# Patient Record
Sex: Female | Born: 1975 | Hispanic: Yes | Marital: Married
Health system: Southern US, Community
[De-identification: ages and names within clinical notes are randomized; demographics above are authoritative.]

---

## 2005-05-05 ENCOUNTER — Observation Stay: Payer: Self-pay | Admitting: Certified Nurse Midwife

## 2005-07-08 ENCOUNTER — Observation Stay: Payer: Self-pay | Admitting: Obstetrics and Gynecology

## 2005-07-08 ENCOUNTER — Inpatient Hospital Stay: Payer: Self-pay | Admitting: Obstetrics and Gynecology

## 2008-09-24 ENCOUNTER — Ambulatory Visit: Payer: Self-pay | Admitting: Family Medicine

## 2009-03-01 ENCOUNTER — Observation Stay: Payer: Self-pay | Admitting: Obstetrics and Gynecology

## 2009-03-18 ENCOUNTER — Observation Stay: Payer: Self-pay | Admitting: Obstetrics and Gynecology

## 2009-03-23 ENCOUNTER — Observation Stay: Payer: Self-pay | Admitting: Obstetrics and Gynecology

## 2009-04-10 ENCOUNTER — Inpatient Hospital Stay: Payer: Self-pay | Admitting: Obstetrics and Gynecology

## 2011-09-09 ENCOUNTER — Emergency Department: Payer: Self-pay | Admitting: Internal Medicine

## 2011-09-09 LAB — CBC
HCT: 39.4 %
HGB: 13.2 g/dL
MCH: 33.1 pg
MCHC: 33.4 g/dL
MCV: 99 fL
Platelet: 199 x10 3/mm 3
RBC: 3.98 X10 6/mm 3
RDW: 12.9 %
WBC: 7 x10 3/mm 3

## 2011-09-09 LAB — URINALYSIS, COMPLETE
Bacteria: NONE SEEN
Bilirubin,UR: NEGATIVE
Glucose,UR: NEGATIVE mg/dL
Ketone: NEGATIVE
Leukocyte Esterase: NEGATIVE
Nitrite: NEGATIVE
Ph: 6
Protein: NEGATIVE
RBC,UR: <1 /HPF
Specific Gravity: 1.02
Squamous Epithelial: 3
WBC UR: <1 /HPF

## 2011-09-25 ENCOUNTER — Ambulatory Visit: Payer: Self-pay | Admitting: Obstetrics and Gynecology

## 2011-09-25 LAB — URINALYSIS, COMPLETE
Glucose,UR: NEGATIVE mg/dL (ref 0–75)
Leukocyte Esterase: NEGATIVE
Ph: 5 (ref 4.5–8.0)
RBC,UR: 1 /HPF (ref 0–5)
Squamous Epithelial: 6
WBC UR: 1 /HPF (ref 0–5)

## 2012-03-01 ENCOUNTER — Ambulatory Visit: Payer: Self-pay | Admitting: Family Medicine

## 2013-11-26 ENCOUNTER — Ambulatory Visit: Payer: Self-pay

## 2014-07-19 NOTE — Op Note (Signed)
PATIENT NAME:  Patricia Bush, [name] MR#:  [id number] DATE OF BIRTH:  Feb 21, 1976  DATE OF PROCEDURE:  09/25/2011  PREOPERATIVE DIAGNOSIS: Incomplete abortion.   POSTOPERATIVE DIAGNOSIS: Incomplete abortion.   PROCEDURE: Suction dilatation and curettage.   SURGEON: Ricky L. Logan BoresEvans, MD  ANESTHESIA: Bag and mask.  FINDINGS: Empty sac unchanged after 10 days on ultrasound.   SPECIMENS: Products of conception.   ESTIMATED BLOOD LOSS: 100 mL.   COMPLICATIONS: None.   DRAINS: None.   PROCEDURE IN DETAIL: Patient was followed in the office with above diagnosis of incomplete abortion. Discussed options, elected to proceed with above procedure. Consent was signed. Preoperative antibiotics given. Taken to the Operating Room, placed in supine position where anesthesia was initiated then placed in dorsal lithotomy position using candy cane stirrups, prepped and draped in usual sterile fashion. Cervix was visualized, grasped with single-tooth tenaculum, easily sounded up to permit a #8 suction curette which was passed with return of sac and ultimately minimally bubbly blood with tightening of the os.   Polyp forceps then used to gently explore the cornua with no evidence of  product. Suction curette passed one more time. Again good uterine cry was noted throughout. Procedure was felt to achieve maximum efficacy. Instruments removed. Cervix was hemostatic. Bimanual showed a firming uterus.   Patient tolerated procedure well. She will be discharged home with light activity, pelvic rest for two weeks, antibiotics for five days and to be followed up in my office.   ____________________________ Reatha Harpsicky L. Logan BoresEvans, MD rle:cms D: 09/25/2011 14:42:33 ET T: 09/25/2011 15:14:57 ET JOB#: [id number]  cc: Ricky L. Logan BoresEvans, MD, <Dictator>  Augustina MoodICK L Samanatha Brammer MD ELECTRONICALLY SIGNED 09/26/2011 14:36

## 2017-06-21 ENCOUNTER — Ambulatory Visit: Payer: Self-pay | Admitting: Psychology

## 2019-06-06 ENCOUNTER — Emergency Department
Admission: EM | Admit: 2019-06-06 | Discharge: 2019-06-07 | Disposition: A | Discharge status: Home / Self Care | Payer: BC Managed Care – PPO | Attending: Emergency Medicine | Admitting: Emergency Medicine

## 2019-06-06 ENCOUNTER — Other Ambulatory Visit: Payer: Self-pay

## 2019-06-06 ENCOUNTER — Emergency Department: Payer: BC Managed Care – PPO

## 2019-06-06 DIAGNOSIS — R202 Paresthesia of skin: Secondary | ICD-10-CM | POA: Insufficient documentation

## 2019-06-06 DIAGNOSIS — R471 Dysarthria and anarthria: Secondary | ICD-10-CM | POA: Diagnosis not present

## 2019-06-06 DIAGNOSIS — R519 Headache, unspecified: Secondary | ICD-10-CM | POA: Diagnosis present

## 2019-06-06 DIAGNOSIS — R531 Weakness: Secondary | ICD-10-CM | POA: Insufficient documentation

## 2019-06-06 DIAGNOSIS — Z20822 Contact with and (suspected) exposure to covid-19: Secondary | ICD-10-CM | POA: Diagnosis not present

## 2019-06-06 DIAGNOSIS — G43101 Migraine with aura, not intractable, with status migrainosus: Secondary | ICD-10-CM | POA: Insufficient documentation

## 2019-06-06 LAB — GLUCOSE, CAPILLARY: Glucose-Capillary: 116 mg/dL — ABNORMAL HIGH (ref 70–99)

## 2019-06-06 NOTE — ED Triage Notes (Signed)
Info obtained via [facility] interpreter Services.  Patient reports started feeling really bad and almost fainted.  Reports symptoms began around 4 pm.  Reports feels like left side of face is having a cramp.

## 2019-06-06 NOTE — ED Notes (Signed)
Report given to Gracie RN.

## 2019-06-06 NOTE — ED Notes (Signed)
Patient c/o light sensitivity and mild nausea per video interpreter. Patient able to move all 4 extremities. Patient c/o weakness on left side.  Patient able to grimace with equal symmetry of eyebrows. Patient had difficulty attempting a smile, but could move tongue side to side easily.

## 2019-06-07 ENCOUNTER — Emergency Department: Payer: BC Managed Care – PPO

## 2019-06-07 LAB — DIFFERENTIAL
Abs Immature Granulocytes: 0.02 10*3/uL (ref 0.00–0.07)
Basophils Absolute: 0 10*3/uL (ref 0.0–0.1)
Basophils Relative: 0 %
Eosinophils Absolute: 0.1 10*3/uL (ref 0.0–0.5)
Eosinophils Relative: 1 %
Immature Granulocytes: 0 %
Lymphocytes Relative: 46 %
Lymphs Abs: 4 10*3/uL (ref 0.7–4.0)
Monocytes Absolute: 0.6 10*3/uL (ref 0.1–1.0)
Monocytes Relative: 6 %
Neutro Abs: 4.1 10*3/uL (ref 1.7–7.7)
Neutrophils Relative %: 47 %

## 2019-06-07 LAB — CBC
HCT: 41.4 % (ref 36.0–46.0)
Hemoglobin: 13.6 g/dL (ref 12.0–15.0)
MCH: 32.5 pg (ref 26.0–34.0)
MCHC: 32.9 g/dL (ref 30.0–36.0)
MCV: 99 fL (ref 80.0–100.0)
Platelets: 184 10*3/uL (ref 150–400)
RBC: 4.18 MIL/uL (ref 3.87–5.11)
RDW: 12 % (ref 11.5–15.5)
WBC: 8.8 10*3/uL (ref 4.0–10.5)
nRBC: 0 % (ref 0.0–0.2)

## 2019-06-07 LAB — COMPREHENSIVE METABOLIC PANEL
ALT: 22 U/L (ref 0–44)
AST: 36 U/L (ref 15–41)
Albumin: 4.3 g/dL (ref 3.5–5.0)
Alkaline Phosphatase: 55 U/L (ref 38–126)
Anion gap: 11 (ref 5–15)
BUN: 15 mg/dL (ref 6–20)
CO2: 24 mmol/L (ref 22–32)
Calcium: 9.3 mg/dL (ref 8.9–10.3)
Chloride: 105 mmol/L (ref 98–111)
Creatinine, Ser: 0.68 mg/dL (ref 0.44–1.00)
GFR calc Af Amer: >60 mL/min (ref >60)
GFR calc non Af Amer: >60 mL/min (ref >60)
Glucose, Bld: 111 mg/dL — ABNORMAL HIGH (ref 70–99)
Potassium: 3.5 mmol/L (ref 3.5–5.1)
Sodium: 140 mmol/L (ref 135–145)
Total Bilirubin: 1.2 mg/dL (ref 0.3–1.2)
Total Protein: 8 g/dL (ref 6.5–8.1)

## 2019-06-07 LAB — APTT: aPTT: 28 seconds (ref 24–36)

## 2019-06-07 LAB — PROTIME-INR
INR: 0.9 (ref 0.8–1.2)
Prothrombin Time: 12.4 seconds (ref 11.4–15.2)

## 2019-06-07 LAB — ETHANOL: Alcohol, Ethyl (B): <10 mg/dL (ref <10)

## 2019-06-07 LAB — SARS CORONAVIRUS 2 (TAT 6-24 HRS): SARS Coronavirus 2: NEGATIVE

## 2019-06-07 MED ORDER — NAPROXEN 500 MG PO TABS: 500.0000 mg | ORAL_TABLET | Freq: Two times a day (BID) | ORAL | 0 refills | Status: AC | PRN

## 2019-06-07 MED ORDER — DIPHENHYDRAMINE HCL 50 MG/ML IJ SOLN
25.0000 mg | Freq: Once | INTRAMUSCULAR | Status: AC
  Administered 2019-06-07: 25 mg via INTRAVENOUS
  Filled 2019-06-07: qty 1

## 2019-06-07 MED ORDER — KETOROLAC TROMETHAMINE 30 MG/ML IJ SOLN
15.0000 mg | INTRAMUSCULAR | Status: AC
  Administered 2019-06-07: 15 mg via INTRAVENOUS
  Filled 2019-06-07: qty 1

## 2019-06-07 MED ORDER — DIPHENHYDRAMINE HCL 25 MG PO CAPS: 50.0000 mg | ORAL_CAPSULE | Freq: Four times a day (QID) | ORAL | 0 refills | Status: AC | PRN

## 2019-06-07 MED ORDER — SODIUM CHLORIDE 0.9 % IV BOLUS
1000.0000 mL | Freq: Once | INTRAVENOUS | Status: AC
  Administered 2019-06-07: 1000 mL via INTRAVENOUS

## 2019-06-07 MED ORDER — METOCLOPRAMIDE HCL 10 MG PO TABS: 10.0000 mg | ORAL_TABLET | Freq: Four times a day (QID) | ORAL | 0 refills | Status: AC | PRN

## 2019-06-07 MED ORDER — METOCLOPRAMIDE HCL 5 MG/ML IJ SOLN
10.0000 mg | Freq: Once | INTRAMUSCULAR | Status: AC
  Administered 2019-06-07: 10 mg via INTRAVENOUS
  Filled 2019-06-07: qty 2

## 2019-06-07 NOTE — ED Notes (Signed)
Neurology called at this time, given phone number [phone] by RN on teleneuro cart to call for teleneuro consult. Spoke with person on phone who said they were alerting the neurologist the cart is in the room for them to speak with pt.

## 2019-06-07 NOTE — Consult Note (Signed)
[facility] Tele[facility]urology Consult Services  Stat Consult  Date of Service:   06/07/2019 00:10:33  Impression:     .  X83.3 - Complicated migraine  Comments/Sign-Out: Although a small vessel stroke as possible I think it is more likely this is complicated migraine. I suggested treating her symptomatically getting an MRI in the morning. She is not a candidate for thrombolytic therapy or interventional procedures. I think also she may have low-lying cerebellar tonsil suggesting a Chiari I malformation but the MRI should tell that two.  CT HEAD: Showed No Acute Hemorrhage or Acute Core Infarct Reviewed No evidence of acute ischemic change, hemorrhage, or mass lesion but possible low-lying cerebellar tonsils.  Metrics: [facility] Notification Time: 06/07/2019 00:10:33 Stamp Time: 06/07/2019 00:10:33 Callback Response Time: 06/07/2019 00:11:39  Our recommendations are outlined below.  Imaging Studies:     .  MRI Head  Therapies:     .  Physical Therapy, Occupational Therapy, Speech Therapy Assessment When Applicable  Disposition: Neurology Follow Up Recommended  Sign Out:     .  Discussed with Emergency Department Provider  ----------------------------------------------------------------------------------------------------  Chief Complaint: Headache and left-sided numbness and weakness  History of Present Illness: Patient is a 44 year old Female.  This 44 year old woman was interviewed and examined for the assistance of a Spanish language interpreter. She was well until 1600 when she developed headache, photophobia, phonophobia, and then sometime thereafter left-sided weakness and numbness including left facial weakness. She's had occasional headaches and she had a similar event prompting a visit to the hospital in 2010 with a negative CT scan. She felt faint. She was not febrile. She has received Benadryl, Toradol, and Reglan with some but incomplete  relief.    Past Medical History:     . Hypertension     . Hyperlipidemia     . There is NO history of Diabetes Mellitus     . There is NO history of Atrial Fibrillation     . There is NO history of Coronary Artery Disease     . There is NO history of Stroke  Anticoagulant use:  No  Antiplatelet use: No     Examination: BP(129/89), Pulse(83), Blood Glucose(111) 1A: Level of Consciousness - Alert; keenly responsive + 0 1B: Ask Month and Age - Both Questions Right + 0 1C: Blink Eyes & Squeeze Hands - Performs Both Tasks + 0 2: Test Horizontal Extraocular Movements - Normal + 0 3: Test Visual Fields - No Visual Loss + 0 4: Test Facial Palsy (Use Grimace if Obtunded) - Normal symmetry + 0 5A: Test Left Arm Motor Drift - No Drift for 10 Seconds + 0 5B: Test Right Arm Motor Drift - No Drift for 10 Seconds + 0 6A: Test Left Leg Motor Drift - No Drift for 5 Seconds + 0 6B: Test Right Leg Motor Drift - No Drift for 5 Seconds + 0 7: Test Limb Ataxia (FNF/Heel-Shin) - No Ataxia + 0 8: Test Sensation - Mild-Moderate Loss: Less Sharp/More Dull + 1 9: Test Language/Aphasia - Normal; No aphasia + 0 10: Test Dysarthria - Normal + 0 11: Test Extinction/Inattention - No abnormality + 0  NIHSS Score: 1   Patient/Family was informed the Neurology Consult would occur via TeleHealth consult by way of interactive audio and video telecommunications and consented to receiving care in this manner.  Due to the immediate potential for life-threatening deterioration due to underlying acute neurologic illness, I spent 33 minutes providing critical care. This time includes  time for face to face visit via telemedicine, review of medical records, imaging studies and discussion of findings with providers, the patient and/or family.   Dr Elray Mcgregor   [facility] [phone]  Case [id number]

## 2019-06-07 NOTE — ED Notes (Signed)
Decision to treat as migraine and get MRI, discussed between MD stafford and Neurologist

## 2019-06-07 NOTE — Discharge Instructions (Addendum)
Your lab tests and MRI of the brain were all normal.  Your symptoms appear to be due to migraine headache.  Take medications as prescribed if symptoms return.  You should follow up with a primary care doctor for continued evaluation of your symptoms.

## 2019-06-07 NOTE — ED Notes (Signed)
Neurologist talking with MD stafford at this time

## 2019-06-07 NOTE — ED Notes (Signed)
Pt transported to MRI 

## 2019-06-07 NOTE — ED Provider Notes (Signed)
[facility] Emergency Department Provider Note  ____________________________________________  Time seen: Approximately 1:27 AM  I have reviewed the triage vital signs and the nursing notes.   HISTORY  Chief Complaint Dizziness  Encounter completed with Spanish video interpreter Byrd Hesselbach  HPI Patricia Bush is a 44 y.o. female with a past history of hyperlipidemia who comes to the ED complaining of generalized headache that started at 4 PM yesterday, constant, gradually worsening, associated with feeling of tingling and weakness in the left face arm and leg.  Also associated with light sensitivity and sound sensitivity.  Nonradiating.  She also reports that her speech is dysarthric but denies word finding difficulty or vision change  She notes that she does get severe headaches frequently, associated with light and sound sensitivity.  They are improved by going to a dark quiet room by herself and they tend to last the whole day.  She denies any neck pain trauma fevers or chills.      Past medical history hyperlipidemia, anxiety   There are no problems to display for this patient.       Prior to Admission medications   Medication Sig Start Date End Date Taking? Authorizing Provider  diphenhydrAMINE (BENADRYL) 25 mg capsule Take 2 capsules (50 mg total) by mouth every 6 (six) hours as needed. 06/07/19   Sharman Cheek, MD  metoCLOPramide (REGLAN) 10 MG tablet Take 1 tablet (10 mg total) by mouth every 6 (six) hours as needed. 06/07/19   Sharman Cheek, MD  naproxen (NAPROSYN) 500 MG tablet Take 1 tablet (500 mg total) by mouth 2 (two) times daily as needed for headache. 06/07/19   Sharman Cheek, MD  Crestor Zoloft   Allergies Patient has no allergy information on record.   No family history on file.  Social History Social History   Tobacco Use  . Smoking status: Not on file  Substance Use Topics  . Alcohol use: Not on file  . Drug  use: Not on file  No tobacco alcohol or drug use  Review of Systems  Constitutional:   No fever or chills.  ENT:   No sore throat. No rhinorrhea. Cardiovascular:   No chest pain or syncope. Respiratory:   No dyspnea or cough. Gastrointestinal:   Negative for abdominal pain, vomiting and diarrhea.  Musculoskeletal:   Negative for focal pain or swelling All other systems reviewed and are negative except as documented above in ROS and HPI.  ____________________________________________   PHYSICAL EXAM:  VITAL SIGNS: ED Triage Vitals  Enc Vitals Group     BP 06/06/19 2156 (!) 151/101     Pulse Rate 06/06/19 2156 91     Resp 06/06/19 2156 18     Temp 06/06/19 2207 98.3 F (36.8 C)     Temp src --      SpO2 06/06/19 2156 100 %     Weight 06/06/19 2151 185 lb (83.9 kg)     Height 06/06/19 2151 5\' 6"  (1.676 m)     Head Circumference --      Peak Flow --      Pain Score 06/06/19 2208 9     Pain Loc --      Pain Edu? --      Excl. in GC? --     Vital signs reviewed, nursing assessments reviewed.   Constitutional:   Alert and oriented. Non-toxic appearance. Eyes:   Conjunctivae are normal. EOMI. PERRL. ENT      Head:   Normocephalic  and atraumatic.      Nose:   Wearing a mask.      Mouth/Throat:   Wearing a mask.      Neck:   No meningismus. Full ROM. Hematological/Lymphatic/Immunilogical:   No cervical lymphadenopathy. Cardiovascular:   RRR. Symmetric bilateral radial and DP pulses.  No murmurs. Cap refill less than 2 seconds. Respiratory:   Normal respiratory effort without tachypnea/retractions. Breath sounds are clear and equal bilaterally. No wheezes/rales/rhonchi. Gastrointestinal:   Soft and nontender. Non distended. There is no CVA tenderness.  No rebound, rigidity, or guarding.  Musculoskeletal:   Normal range of motion in all extremities. No joint effusions.  No lower extremity tenderness.  No edema. Neurologic:   Mild dysarthria Left corner of the mouth facial  droop not involving the forehead Mild weakness left arm Altered sensation left arm NIH stroke scale 4 Skin:    Skin is warm, dry and intact. No rash noted.  No petechiae, purpura, or bullae.  ____________________________________________    LABS (pertinent positives/negatives) (all labs ordered are listed, but only abnormal results are displayed) Labs Reviewed  GLUCOSE, CAPILLARY - Abnormal; Notable for the following components:      Result Value   Glucose-Capillary 116 (*)    All other components within normal limits  COMPREHENSIVE METABOLIC PANEL - Abnormal; Notable for the following components:   Glucose, Bld 111 (*)    All other components within normal limits  SARS CORONAVIRUS 2 (TAT 6-24 HRS)  ETHANOL  PROTIME-INR  APTT  CBC  DIFFERENTIAL  URINE DRUG SCREEN, QUALITATIVE ([facility] ONLY)  URINALYSIS, COMPLETE (UACMP) WITH MICROSCOPIC  POC URINE PREG, ED   ____________________________________________   EKG  Interpreted by me  Date: 06/07/2019  Rate: 84  Rhythm: normal sinus rhythm  QRS Axis: normal  Intervals: normal  ST/T Wave abnormalities: normal  Conduction Disutrbances: none  Narrative Interpretation: unremarkable      ____________________________________________    RADIOLOGY  CT Head Wo Contrast  Result Date: 06/06/2019 CLINICAL DATA:  Headache EXAM: CT HEAD WITHOUT CONTRAST TECHNIQUE: Contiguous axial images were obtained from the base of the skull through the vertex without intravenous contrast. COMPARISON:  CT head 03/01/2009 FINDINGS: Brain: No evidence of acute infarction, hemorrhage, hydrocephalus, extra-axial collection or mass lesion/mass effect. Midline intracranial structures are unremarkable. The cerebellar tonsils appear normally positioned. Vascular: No hyperdense vessel or unexpected calcification. Skull: No calvarial fracture or suspicious osseous lesion. No scalp swelling or hematoma. Sinuses/Orbits: Paranasal sinuses and mastoid air cells  are predominantly clear. Included orbital structures are unremarkable. Other: None IMPRESSION: No acute intracranial findings. Electronically Signed   By: Kreg Shropshire M.D.   On: 06/06/2019 23:06   MR BRAIN WO CONTRAST  Result Date: 06/07/2019 CLINICAL DATA:  Initial evaluation for acute headache, nausea, left-sided weakness. EXAM: MRI HEAD WITHOUT CONTRAST TECHNIQUE: Multiplanar, multiecho pulse sequences of the brain and surrounding structures were obtained without intravenous contrast. COMPARISON:  Prior head CT from 07/29/2019. FINDINGS: Brain: Cerebral volume within normal limits for age. No focal parenchymal signal abnormality identified. No abnormal foci of restricted diffusion to suggest acute or subacute ischemia. Gray-white matter differentiation well maintained. No encephalomalacia to suggest chronic cortical infarction. No foci of susceptibility artifact to suggest acute or chronic intracranial hemorrhage. No mass lesion, midline shift or mass effect. Ventricles normal size without hydrocephalus. No extra-axial fluid collection. Pituitary gland suprasellar region normal. Midline structures intact. Vascular: Major intracranial vascular flow voids are well maintained and normal in appearance. Skull and upper cervical spine: Mild cerebellar  tonsillar ectopia of approximately 3 mm without frank Chiari malformation. Craniocervical junction otherwise unremarkable. Bone marrow signal intensity within normal limits. No scalp soft tissue abnormality. Sinuses/Orbits: Globes and orbital soft tissues within normal limits. Mild scattered mucosal thickening noted within the ethmoidal air cells and maxillary sinuses. No air-fluid level to suggest acute sinusitis. Mastoid air cells are clear. Inner ear structures grossly normal. Other: None. IMPRESSION: 1. No acute intracranial abnormality. 2. 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 3. Otherwise unremarkable and normal brain MRI.  Electronically Signed   By: Rise Mu M.D.   On: 06/07/2019 03:06    ____________________________________________   PROCEDURES Procedures  ____________________________________________  DIFFERENTIAL DIAGNOSIS   Complicated migraine, ischemic stroke  CLINICAL IMPRESSION / ASSESSMENT AND PLAN / ED COURSE  Medications ordered in the ED: Medications  ketorolac (TORADOL) 30 MG/ML injection 15 mg (15 mg Intravenous Given 06/07/19 0025)  metoCLOPramide (REGLAN) injection 10 mg (10 mg Intravenous Given 06/07/19 0024)  diphenhydrAMINE (BENADRYL) injection 25 mg (25 mg Intravenous Given 06/07/19 0024)  sodium chloride 0.9 % bolus 1,000 mL (1,000 mLs Intravenous New Bag/Given 06/07/19 0127)    Pertinent labs & imaging results that were available during my care of the patient were reviewed by me and considered in my medical decision making (see chart for details).  Stephaniemarie Caitlinn Klinker was evaluated in Emergency Department on 06/07/2019 for the symptoms described in the history of present illness. She was evaluated in the context of the global COVID-19 pandemic, which necessitated consideration that the patient might be at risk for infection with the SARS-CoV-2 virus that causes COVID-19. Institutional protocols and algorithms that pertain to the evaluation of patients at risk for COVID-19 are in a state of rapid change based on information released by regulatory bodies including the CDC and federal and state organizations. These policies and algorithms were followed during the patient's care in the ED.   Patient presents with headache pattern highly consistent with migraine.  However, she has some neurologic deficits on exam.  Most likely complicated migraine.  Fingerstick normal.  CT head obtained which is normal.  Will obtain lab panel, neurology consult.  Start treatment with Reglan, Benadryl, Toradol IV.  Clinical Course as of Jun 06 320  Sat Jun 07, 2019  0122 Case discussed with the  neurology consultant who agrees that this is highly likely to be complicated migraine.  At the time of his exam, headache is improving after medications, and neurologic exam is improved.  He scores the stroke scale at 1 for reported sensation change, no weakness or facial droop..  Does not believe she needs a full stroke work-up.  Recommends migraine management and MRI brain, and if unremarkable, she can be discharged home.   [PS]    Clinical Course User Index [PS] Sharman Cheek, MD     ----------------------------------------- 3:22 AM on 06/07/2019 -----------------------------------------  MRI brain unremarkable.  Vital signs remain normal, patient feeling better.  Due to her Zoloft and trazodone, I will not start Imitrex right now, will do Reglan Benadryl naproxen as needed, follow-up with primary care.  ____________________________________________   FINAL CLINICAL IMPRESSION(S) / ED DIAGNOSES    Final diagnoses:  Complicated migraine with status migrainosus     ED Discharge Orders         Ordered    metoCLOPramide (REGLAN) 10 MG tablet  Every 6 hours PRN     06/07/19 0321    diphenhydrAMINE (BENADRYL) 25 mg capsule  Every 6 hours PRN  06/07/19 0321    naproxen (NAPROSYN) 500 MG tablet  2 times daily PRN     06/07/19 0321          Portions of this note were generated with dragon dictation software. Dictation errors may occur despite best attempts at proofreading.   Carrie Mew, MD 06/07/19 [phone]

## 2019-06-07 NOTE — ED Notes (Signed)
Neurologist on with pt at this time

## 2019-06-21 ENCOUNTER — Ambulatory Visit: Payer: BC Managed Care – PPO | Attending: Internal Medicine

## 2019-06-22 ENCOUNTER — Ambulatory Visit: Payer: BC Managed Care – PPO

## 2019-06-22 ENCOUNTER — Ambulatory Visit: Payer: BC Managed Care – PPO | Attending: Internal Medicine

## 2019-06-22 DIAGNOSIS — Z23 Encounter for immunization: Secondary | ICD-10-CM

## 2019-06-22 NOTE — Progress Notes (Signed)
   Covid-19 Vaccination Clinic  Name:  Patricia Bush    MRN: [id number] DOB: 27-Oct-1975  06/22/2019  Ms. Patricia Bush was observed post Covid-19 immunization for 15 minutes without incident. She was provided with Vaccine Information Sheet and instruction to access the V-Safe system.   Ms. Patricia Bush was instructed to call 911 with any severe reactions post vaccine: Marland Kitchen Difficulty breathing  . Swelling of face and throat  . A fast heartbeat  . A bad rash all over body  . Dizziness and weakness   Immunizations Administered    Name Date Dose VIS Date Route   Pfizer COVID-19 Vaccine 06/22/2019  1:22 PM 0.3 mL 03/07/2019 Intramuscular   Manufacturer: [employer], [employer]   Lot: [id number]   NDC: [address]-8

## 2019-07-12 ENCOUNTER — Ambulatory Visit: Payer: BC Managed Care – PPO

## 2019-07-13 ENCOUNTER — Ambulatory Visit: Payer: BC Managed Care – PPO | Attending: Internal Medicine

## 2019-07-13 DIAGNOSIS — Z23 Encounter for immunization: Secondary | ICD-10-CM

## 2019-07-13 NOTE — Progress Notes (Signed)
   Covid-19 Vaccination Clinic  Name:  Patricia Bush    MRN: [id number] DOB: Jun 11, 1975  07/13/2019  Ms. Patricia Bush was observed post Covid-19 immunization for 15 minutes without incident. She was provided with Vaccine Information Sheet and instruction to access the V-Safe system.   Ms. Patricia Bush was instructed to call 911 with any severe reactions post vaccine: Marland Kitchen Difficulty breathing  . Swelling of face and throat  . A fast heartbeat  . A bad rash all over body  . Dizziness and weakness   Immunizations Administered    Name Date Dose VIS Date Route   Pfizer COVID-19 Vaccine 07/13/2019  1:22 PM 0.3 mL 03/07/2019 Intramuscular   Manufacturer: [employer], [employer]   Lot: [id number]   NDC: [address]-8

## 2019-12-08 ENCOUNTER — Other Ambulatory Visit: Payer: Self-pay | Admitting: Internal Medicine

## 2019-12-08 DIAGNOSIS — Z1231 Encounter for screening mammogram for malignant neoplasm of breast: Secondary | ICD-10-CM

## 2020-01-02 ENCOUNTER — Ambulatory Visit
Admission: RE | Admit: 2020-01-02 | Discharge: 2020-01-02 | Disposition: A | Discharge status: Home / Self Care | Payer: BC Managed Care – PPO | Source: Ambulatory Visit | Attending: Internal Medicine | Admitting: Internal Medicine

## 2020-01-02 ENCOUNTER — Other Ambulatory Visit: Payer: Self-pay

## 2020-01-02 DIAGNOSIS — Z1231 Encounter for screening mammogram for malignant neoplasm of breast: Secondary | ICD-10-CM | POA: Diagnosis present

## 2021-03-15 ENCOUNTER — Other Ambulatory Visit: Payer: Self-pay | Admitting: Internal Medicine

## 2021-03-15 DIAGNOSIS — Z1231 Encounter for screening mammogram for malignant neoplasm of breast: Secondary | ICD-10-CM

## 2021-03-17 ENCOUNTER — Ambulatory Visit
Admission: RE | Admit: 2021-03-17 | Discharge: 2021-03-17 | Disposition: A | Discharge status: Home / Self Care | Payer: Commercial Managed Care - PPO | Source: Ambulatory Visit | Attending: Internal Medicine | Admitting: Internal Medicine

## 2021-03-17 ENCOUNTER — Other Ambulatory Visit: Payer: Self-pay

## 2021-03-17 DIAGNOSIS — Z1231 Encounter for screening mammogram for malignant neoplasm of breast: Secondary | ICD-10-CM | POA: Insufficient documentation

## 2022-02-10 ENCOUNTER — Other Ambulatory Visit: Payer: Self-pay | Admitting: Physician Assistant

## 2022-02-10 DIAGNOSIS — R7989 Other specified abnormal findings of blood chemistry: Secondary | ICD-10-CM

## 2022-02-22 ENCOUNTER — Ambulatory Visit: Payer: Commercial Managed Care - PPO

## 2022-09-23 IMAGING — MG MM DIGITAL SCREENING BILAT W/ TOMO AND CAD
8 series · 8 of 24 positions shown · non-contrast
Comparison: Previous exam(s).

CLINICAL DATA: Screening.

EXAM:
DIGITAL SCREENING BILATERAL MAMMOGRAM WITH TOMOSYNTHESIS AND CAD
TECHNIQUE: Bilateral screening digital craniocaudal and mediolateral oblique
mammograms were obtained. Bilateral screening digital breast
tomosynthesis was performed. The images were evaluated with
computer-aided detection.

[R MLO synth-2D]
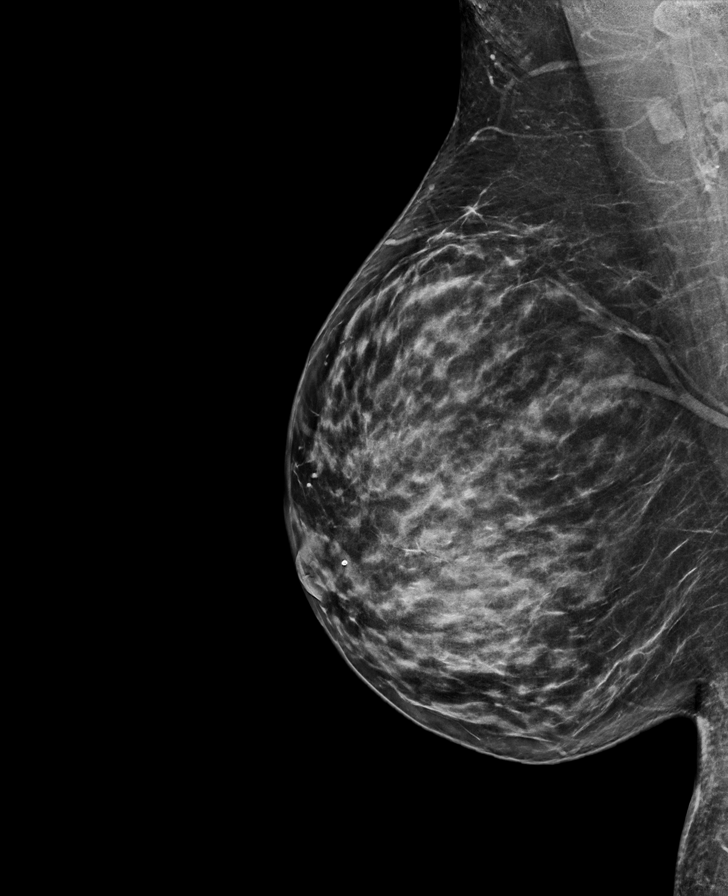

[L CC synth-2D]
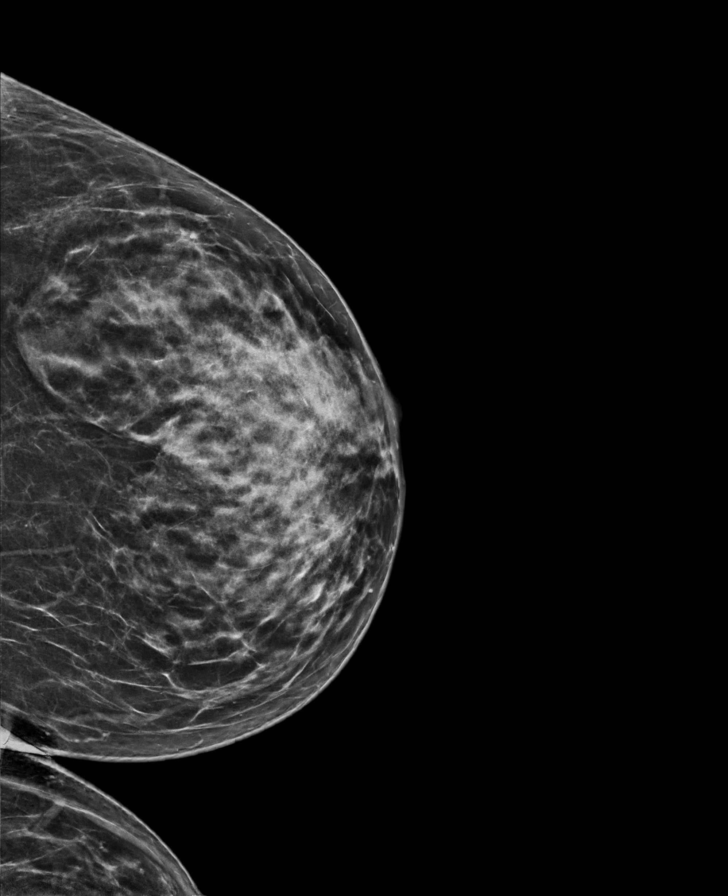

[L MLO synth-2D]
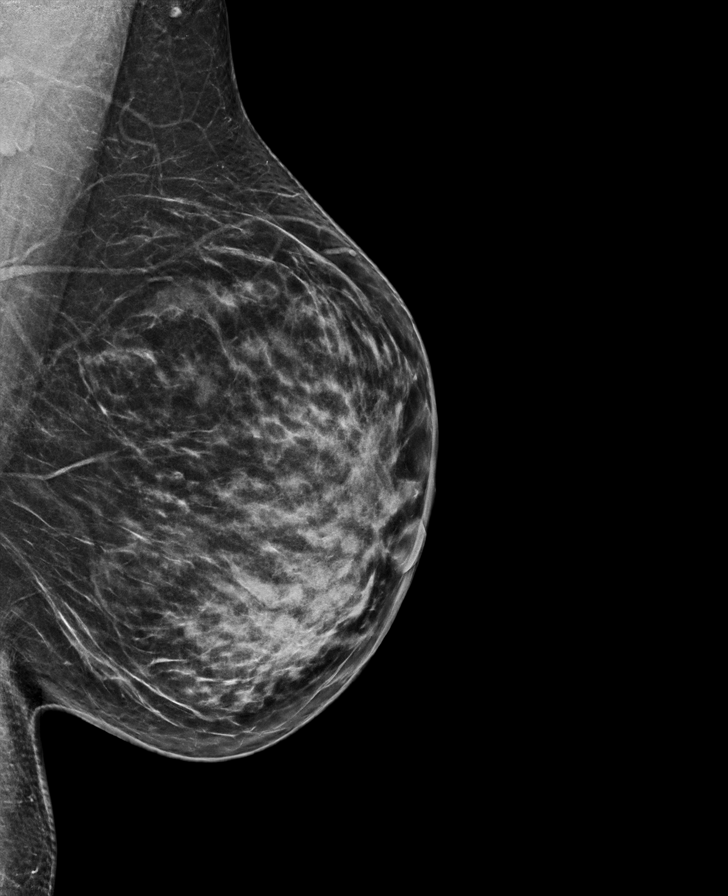

[R CC synth-2D]
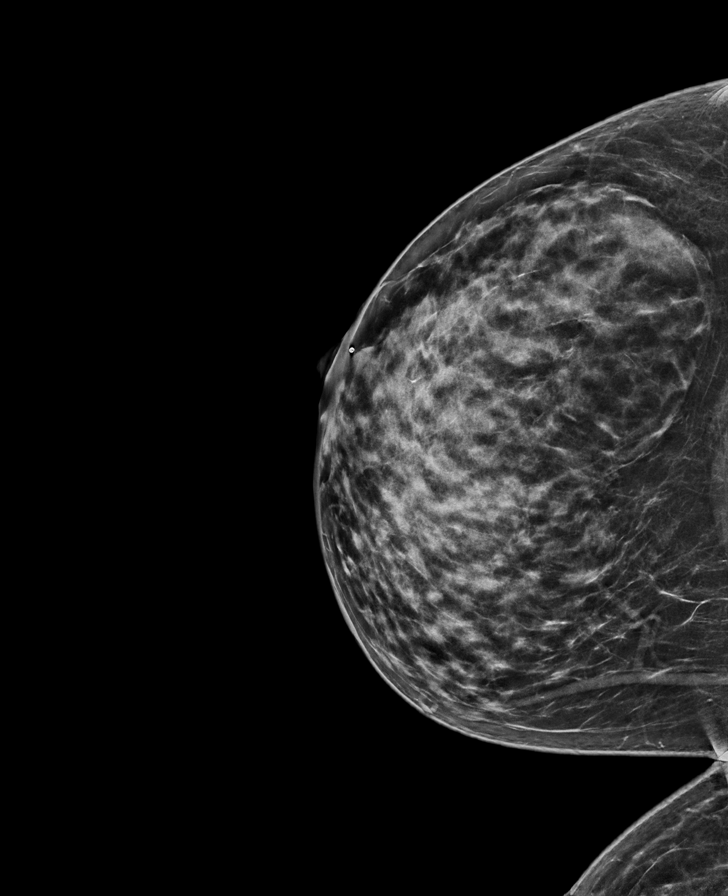

[L CC tomo · tomo slice 33/64.0]
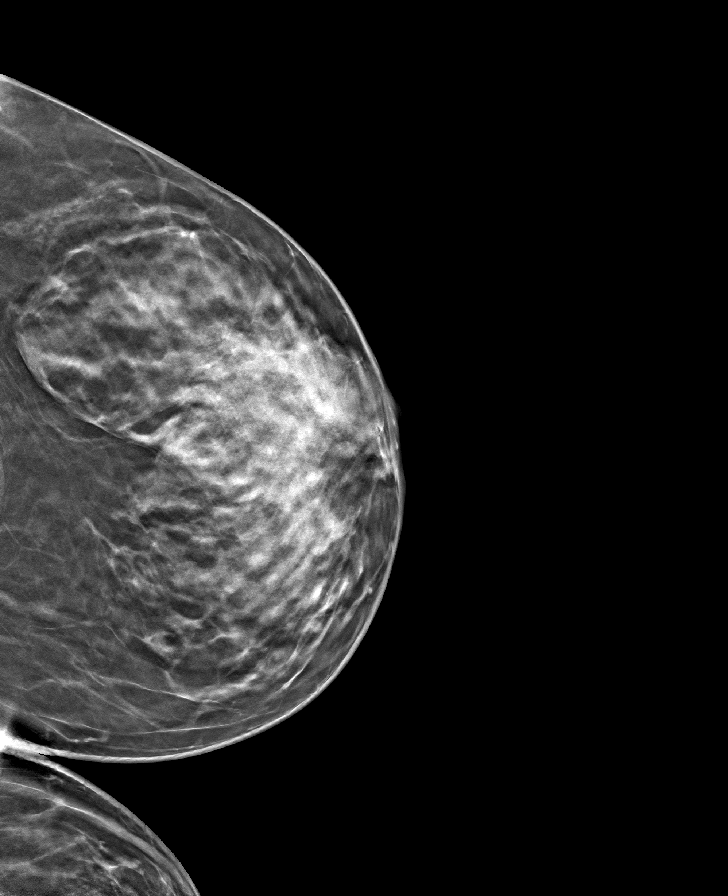

[R MLO tomo · tomo slice 35/68.0]
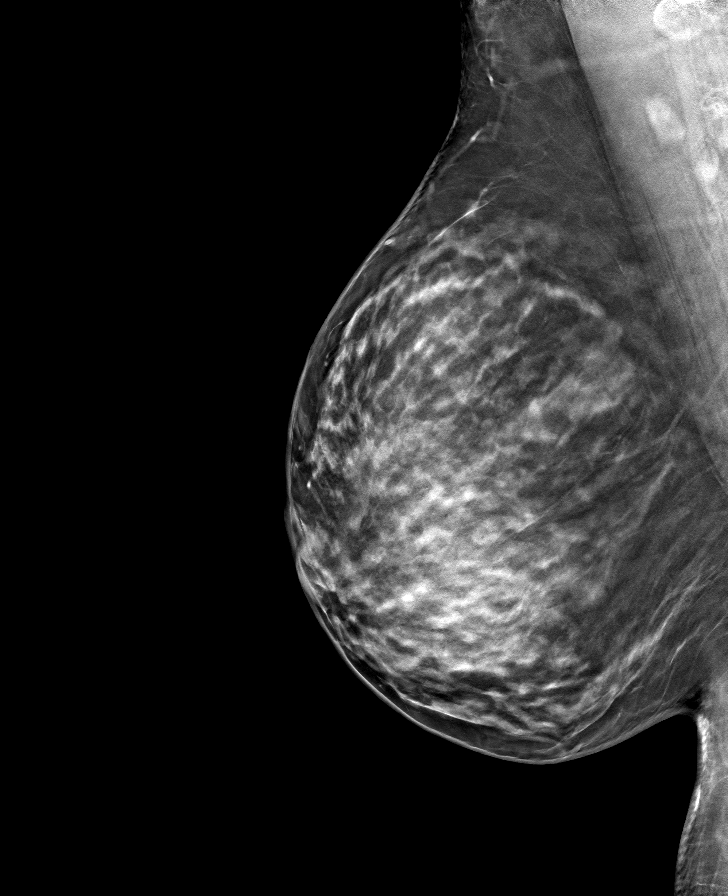

[L MLO tomo · tomo slice 33/64.0]
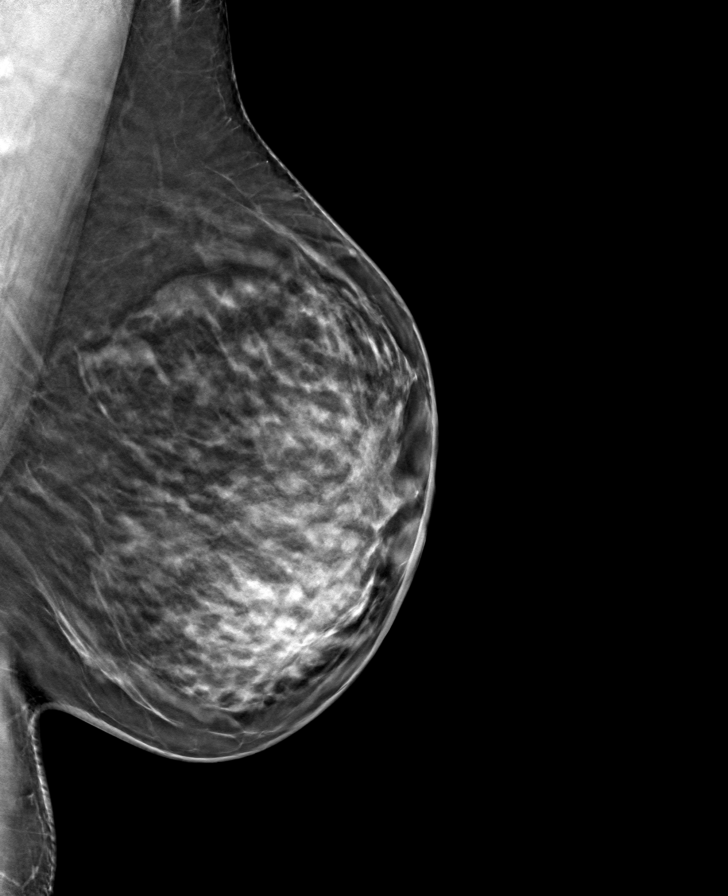

[R CC tomo · tomo slice 33/65.0]
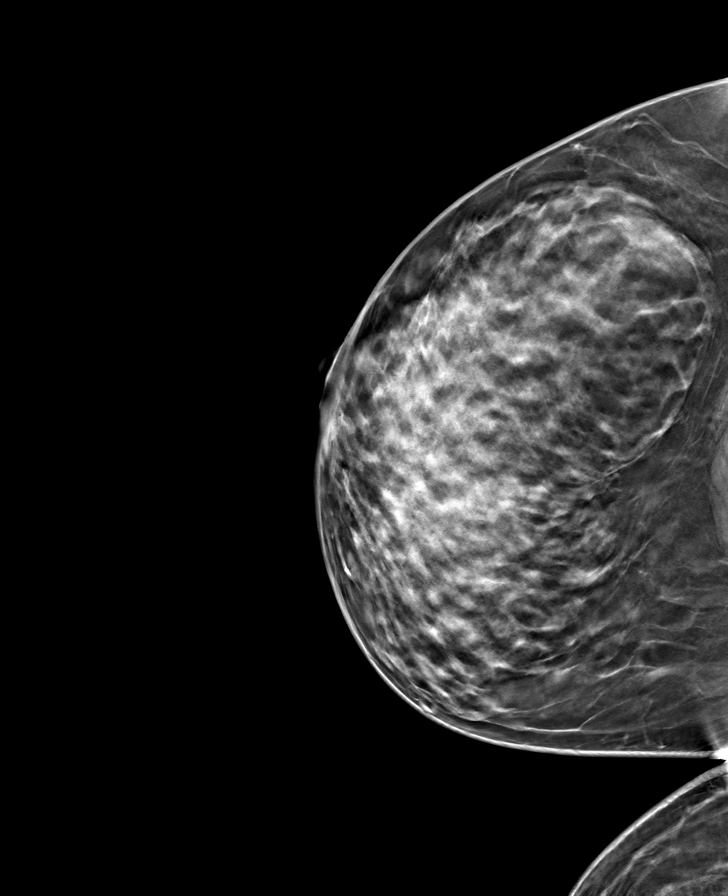

[8 of 24 positions shown; findings below may reference images not displayed]

ACR Breast Density Category c: The breast tissue is heterogeneously
dense, which may obscure small masses.
FINDINGS: There are no findings suspicious for malignancy.
IMPRESSION: No mammographic evidence of malignancy. A result letter of this
screening mammogram will be mailed directly to the patient.

RECOMMENDATION:
Screening mammogram in one year. (Code:Q3-W-BC3)

BI-RADS CATEGORY  1: Negative.

## 2023-02-02 ENCOUNTER — Other Ambulatory Visit: Payer: Self-pay | Admitting: Internal Medicine

## 2023-02-02 DIAGNOSIS — Z1231 Encounter for screening mammogram for malignant neoplasm of breast: Secondary | ICD-10-CM

## 2023-02-20 ENCOUNTER — Ambulatory Visit
Admission: RE | Admit: 2023-02-20 | Discharge: 2023-02-20 | Disposition: A | Discharge status: Home / Self Care | Payer: Commercial Managed Care - PPO | Source: Ambulatory Visit | Attending: Internal Medicine | Admitting: Internal Medicine

## 2023-02-20 DIAGNOSIS — Z1231 Encounter for screening mammogram for malignant neoplasm of breast: Secondary | ICD-10-CM | POA: Insufficient documentation

## 2024-01-29 ENCOUNTER — Other Ambulatory Visit: Payer: Self-pay | Admitting: Internal Medicine

## 2024-01-29 DIAGNOSIS — R7989 Other specified abnormal findings of blood chemistry: Secondary | ICD-10-CM

## 2024-01-31 ENCOUNTER — Ambulatory Visit
Admission: RE | Admit: 2024-01-31 | Discharge: 2024-01-31 | Disposition: A | Discharge status: Home / Self Care | Source: Ambulatory Visit | Attending: Internal Medicine | Admitting: Internal Medicine

## 2024-01-31 DIAGNOSIS — R7989 Other specified abnormal findings of blood chemistry: Secondary | ICD-10-CM | POA: Diagnosis present
